# Patient Record
Sex: Female | Born: 1995 | Race: White | Hispanic: No | Marital: Single | Smoking: Never smoker
Health system: Southern US, Community
[De-identification: ages and names within clinical notes are randomized; demographics above are authoritative.]

---
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
## 2021-05-30 ENCOUNTER — Other Ambulatory Visit: Payer: Self-pay

## 2021-05-30 ENCOUNTER — Encounter: Payer: Self-pay | Admitting: Emergency Medicine

## 2021-05-30 DIAGNOSIS — Z5321 Procedure and treatment not carried out due to patient leaving prior to being seen by health care provider: Secondary | ICD-10-CM | POA: Insufficient documentation

## 2021-05-30 DIAGNOSIS — R109 Unspecified abdominal pain: Secondary | ICD-10-CM | POA: Insufficient documentation

## 2021-05-30 DIAGNOSIS — K625 Hemorrhage of anus and rectum: Secondary | ICD-10-CM | POA: Insufficient documentation

## 2021-05-30 LAB — CBC WITH DIFFERENTIAL/PLATELET
Abs Immature Granulocytes: 0.03 10*3/uL (ref 0.00–0.07)
Basophils Absolute: 0 10*3/uL (ref 0.0–0.1)
Basophils Relative: 0 %
Eosinophils Absolute: 0.2 10*3/uL (ref 0.0–0.5)
Eosinophils Relative: 3 %
HCT: 35.1 % — ABNORMAL LOW (ref 36.0–46.0)
Hemoglobin: 11.1 g/dL — ABNORMAL LOW (ref 12.0–15.0)
Immature Granulocytes: 0 %
Lymphocytes Relative: 28 %
Lymphs Abs: 2.5 10*3/uL (ref 0.7–4.0)
MCH: 29.3 pg (ref 26.0–34.0)
MCHC: 31.6 g/dL (ref 30.0–36.0)
MCV: 92.6 fL (ref 80.0–100.0)
Monocytes Absolute: 0.7 10*3/uL (ref 0.1–1.0)
Monocytes Relative: 7 %
Neutro Abs: 5.5 10*3/uL (ref 1.7–7.7)
Neutrophils Relative %: 62 %
Platelets: 254 10*3/uL (ref 150–400)
RBC: 3.79 MIL/uL — ABNORMAL LOW (ref 3.87–5.11)
RDW: 13.9 % (ref 11.5–15.5)
WBC: 8.9 10*3/uL (ref 4.0–10.5)
nRBC: 0 % (ref 0.0–0.2)

## 2021-05-30 LAB — COMPREHENSIVE METABOLIC PANEL
ALT: 13 U/L (ref 0–44)
AST: 15 U/L (ref 15–41)
Albumin: 3.8 g/dL (ref 3.5–5.0)
Alkaline Phosphatase: 36 U/L — ABNORMAL LOW (ref 38–126)
Anion gap: 4 — ABNORMAL LOW (ref 5–15)
BUN: 8 mg/dL (ref 6–20)
CO2: 27 mmol/L (ref 22–32)
Calcium: 8.9 mg/dL (ref 8.9–10.3)
Chloride: 106 mmol/L (ref 98–111)
Creatinine, Ser: 0.69 mg/dL (ref 0.44–1.00)
GFR, Estimated: >60 mL/min (ref >60)
Glucose, Bld: 107 mg/dL — ABNORMAL HIGH (ref 70–99)
Potassium: 3.6 mmol/L (ref 3.5–5.1)
Sodium: 137 mmol/L (ref 135–145)
Total Bilirubin: 0.4 mg/dL (ref 0.3–1.2)
Total Protein: 7 g/dL (ref 6.5–8.1)

## 2021-05-30 LAB — URINALYSIS, ROUTINE W REFLEX MICROSCOPIC
Bilirubin Urine: NEGATIVE
Glucose, UA: NEGATIVE mg/dL
Ketones, ur: NEGATIVE mg/dL
Leukocytes,Ua: NEGATIVE
Nitrite: NEGATIVE
Protein, ur: NEGATIVE mg/dL
Specific Gravity, Urine: 1.02 (ref 1.005–1.030)
pH: 7.5 (ref 5.0–8.0)

## 2021-05-30 LAB — POC URINE PREG, ED: Preg Test, Ur: NEGATIVE

## 2021-05-30 LAB — URINALYSIS, MICROSCOPIC (REFLEX): Bacteria, UA: NONE SEEN

## 2021-05-30 LAB — LIPASE, BLOOD: Lipase: 32 U/L (ref 11–51)

## 2021-05-30 NOTE — ED Triage Notes (Signed)
Patient ambulatory to triage with steady gait, without difficulty or distress noted; pt reports rectal bleeding for last yr, c/o lower abd pain as well

## 2021-05-31 ENCOUNTER — Emergency Department
Admission: EM | Admit: 2021-05-31 | Discharge: 2021-05-31 | Disposition: A | Discharge status: Home / Self Care | Payer: Self-pay | Attending: Emergency Medicine | Admitting: Emergency Medicine

## 2021-05-31 NOTE — ED Notes (Signed)
No answer when called several times from lobby 

## 2022-08-14 LAB — PANORAMA PRENATAL TEST FULL PANEL:PANORAMA TEST PLUS 5 ADDITIONAL MICRODELETIONS: FETAL FRACTION: 13.4

## 2023-08-28 ENCOUNTER — Ambulatory Visit
Admission: EM | Admit: 2023-08-28 | Discharge: 2023-08-28 | Attending: Emergency Medicine | Admitting: Emergency Medicine

## 2023-08-28 DIAGNOSIS — R1031 Right lower quadrant pain: Secondary | ICD-10-CM | POA: Insufficient documentation

## 2023-08-28 LAB — URINALYSIS, W/ REFLEX TO CULTURE (INFECTION SUSPECTED)
Bilirubin Urine: NEGATIVE
Glucose, UA: NEGATIVE mg/dL
Hgb urine dipstick: NEGATIVE
Ketones, ur: NEGATIVE mg/dL
Leukocytes,Ua: NEGATIVE
Nitrite: NEGATIVE
Protein, ur: NEGATIVE mg/dL
Specific Gravity, Urine: 1.015 (ref 1.005–1.030)
pH: 6 (ref 5.0–8.0)

## 2023-08-28 LAB — PREGNANCY, URINE: Preg Test, Ur: NEGATIVE

## 2023-08-28 MED ORDER — KETOROLAC TROMETHAMINE 30 MG/ML IJ SOLN
30.0000 mg | Freq: Once | INTRAMUSCULAR | Status: AC
  Administered 2023-08-28: 30 mg via INTRAMUSCULAR

## 2023-08-28 NOTE — ED Triage Notes (Addendum)
 RLQ pain x 2 days. Patient states that this has happened before about a month ago. States that nothing made it better the pain just went away. No fever. No other sx. Patient states that pain is worse when she walks. 10 months postpartum. LMP 08/13/23. Last BM this afternoon. Normal BM

## 2023-08-28 NOTE — ED Notes (Signed)
 Patient is being discharged from the Urgent Care and sent to the Emergency Department via personal vehicle . Per Dr. Chaney Malling, patient is in need of higher level of care due to right side abdominal pain. Patient is aware and verbalizes understanding of plan of care.  Vitals:   08/28/23 1924  BP: (!) 140/98  Pulse: 88  Resp: 19  Temp: 98.1 F (36.7 C)  SpO2: 97%

## 2023-08-28 NOTE — Discharge Instructions (Signed)
 Your UA is negative for UTI, urine pregnancy negative.  Differential includes appendicitis, TOA, ovarian cyst, ovarian torsion, obstructing nephrolithiasis, colitis.  Unfortunately, we do not have imaging to evaluate for these entities.  I have given you Toradol 30 mg IM here.  Do not have anything to eat or drink until your ER evaluation is complete.  Let them know if your pain changes or gets worse.

## 2023-08-28 NOTE — ED Provider Notes (Signed)
 HPI  SUBJECTIVE:  Norma Fletcher is a 28 y.o. female who presents with sharp, throbbing, intermittent, hours long nonmigratory, nonradiating right lower quadrant/flank pain that is getting worse.  Symptoms started yesterday.  She reports anorexia, and states that the car ride over here was painful.  No nausea, vomiting, fevers, urinary complaints, vaginal odor, bleeding, discharge, abdominal distention, back pain.  She had a normal bowel movement at 1630 today with no change in her pain.  She had similar symptoms like this 1 month ago, but it resolved on its own.  This episode is worse than last months episode, rating today's pain as 9/10.  No antipyretic in the past 6 hours.  She tried Aleve with some improvement in her symptoms.  Symptoms are worse with walking.  Is not associated with p.o., urination or defecation.  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  No history of UTI, pyelonephritis, nephrolithiasis, PCOS, ovarian cyst, PID, STDs, ovarian torsion, abdominal surgeries.  LMP: 3/10.  PCP: [facility].  History reviewed. No pertinent past medical history.  History reviewed. No pertinent surgical history.  History reviewed. No pertinent family history.  Social History   Tobacco Use   Smoking status: Never  Vaping Use   Vaping status: Never Used  Substance Use Topics   Alcohol use: No   Drug use: Yes    Types: Marijuana    No current facility-administered medications for this encounter. No current outpatient medications on file.  No Known Allergies   ROS  As noted in HPI.   Physical Exam  BP (!) 140/98 (BP Location: Left Arm)   Pulse 88   Temp 98.1 F (36.7 C) (Oral)   Resp 19   LMP 08/13/2023 (Exact Date)   SpO2 97%   Constitutional: Well developed, well nourished, appears uncomfortable. Eyes:  EOMI, conjunctiva normal bilaterally HENT: Normocephalic, atraumatic,mucus membranes moist Respiratory: Normal inspiratory effort Cardiovascular: Normal rate GI:  nondistended, soft.  Positive right flank and right lower quadrant tenderness with mild rebound.  No guarding.  Active bowel sounds.  Negative Rovsing.  Negative Murphy. Back: No CVAT skin: No rash, skin intact Musculoskeletal: no deformities Neurologic: Alert & oriented x 3, no focal neuro deficits Psychiatric: Speech and behavior appropriate   ED Course   Medications  ketorolac (TORADOL) 30 MG/ML injection 30 mg (30 mg Intramuscular Given 08/28/23 2016)    Orders Placed This Encounter  Procedures   Urinalysis, w/ Reflex to Culture (Infection Suspected) -Urine, Clean Catch    Standing Status:   Standing    Number of Occurrences:   1    Specimen Source:   Urine, Clean Catch [76]   Pregnancy, urine    Standing Status:   Standing    Number of Occurrences:   1    Results for orders placed or performed during the hospital encounter of 08/28/23 (from the past 24 hours)  Urinalysis, w/ Reflex to Culture (Infection Suspected) -Urine, Clean Catch     Status: Abnormal   Collection Time: 08/28/23  7:29 PM  Result Value Ref Range   Specimen Source URINE, CLEAN CATCH    Color, Urine YELLOW YELLOW   APPearance CLEAR CLEAR   Specific Gravity, Urine 1.015 1.005 - 1.030   pH 6.0 5.0 - 8.0   Glucose, UA NEGATIVE NEGATIVE mg/dL   Hgb urine dipstick NEGATIVE NEGATIVE   Bilirubin Urine NEGATIVE NEGATIVE   Ketones, ur NEGATIVE NEGATIVE mg/dL   Protein, ur NEGATIVE NEGATIVE mg/dL   Nitrite NEGATIVE NEGATIVE  Leukocytes,Ua NEGATIVE NEGATIVE   Squamous Epithelial / HPF 6-10 0 - 5 /HPF   WBC, UA 0-5 0 - 5 WBC/hpf   RBC / HPF 0-5 0 - 5 RBC/hpf   Bacteria, UA FEW (A) NONE SEEN  Pregnancy, urine     Status: None   Collection Time: 08/28/23  7:29 PM  Result Value Ref Range   Preg Test, Ur NEGATIVE NEGATIVE   No results found.  ED Clinical Impression  1. Right lower quadrant abdominal pain      ED Assessment/Plan     UA negative for UTI, urine pregnancy negative.  Differential  includes appendicitis, TOA, ovarian cyst, ovarian torsion, obstructing nephrolithiasis, colitis.  Unfortunately, we do not have imaging to evaluate for these entities.  Giving Toradol 30 mg IM here.  Advised patient to be n.p.o. until ER evaluation is complete.  They are going to go to [facility] emergency department.  Discussed rationale for transfer to the emergency department.  She is stable to go by private vehicle.  They agree to go. .   Meds ordered this encounter  Medications   ketorolac (TORADOL) 30 MG/ML injection 30 mg      *This clinic note was created using Scientist, clinical (histocompatibility and immunogenetics). Therefore, there may be occasional mistakes despite careful proofreading.  ?    Domenick Gong, MD 08/28/23 2019

## 2024-03-21 ENCOUNTER — Ambulatory Visit

## 2024-03-21 DIAGNOSIS — Z113 Encounter for screening for infections with a predominantly sexual mode of transmission: Secondary | ICD-10-CM

## 2024-03-21 DIAGNOSIS — B3731 Acute candidiasis of vulva and vagina: Secondary | ICD-10-CM

## 2024-03-21 LAB — WET PREP FOR TRICH, YEAST, CLUE
Clue Cell Exam: NEGATIVE
Trichomonas Exam: NEGATIVE

## 2024-03-21 LAB — HM HIV SCREENING LAB: HM HIV Screening: NEGATIVE

## 2024-03-21 MED ORDER — FLUCONAZOLE 150 MG PO TABS: 150.0000 mg | ORAL_TABLET | Freq: Once | ORAL | Status: AC

## 2024-03-21 NOTE — Progress Notes (Signed)
 [facility] Department STI clinic [address], [address] [place] [address] Main phone: [phone]  STI screening visit  Subjective:  Norma Fletcher is a 28 y.o. female being seen today for an STI screening visit. The patient reports they do not have symptoms.  Patient reports that they do not desire a pregnancy in the next year. Patient is currently using female condom to prevent pregnancy. They reported they are not interested in discussing contraception today.    Patient's last menstrual period was 02/21/2024 (approximate).  Patient has the following medical conditions:  There are no active problems to display for this patient.  Chief Complaint  Patient presents with   SEXUALLY TRANSMITTED DISEASE   HPI Patient reports vaginal irritation, swelling, itching, odor. Unfortunately found out that her partner had other partners. Last sex was 03/16/24 and her symptoms started over last few days. Does the patient using douching products? No  See flowsheet for further details and programmatic requirements Hyperlink available at the top of the signed note in blue.  Flow sheet content below:  Pregnancy Intention Screening Does the patient want to become pregnant in the next year?: No Does the patient's partner want to become pregnant in the next year?: No Would the patient like to discuss contraceptive options today?: No Reason For STD Screen STD Screening: Has symptoms Have you ever had an STD?: No History of Antibiotic use in the past 2 weeks?: No STD Symptoms Denies all: No Genital Itching: Yes Lower abdominal pain: Yes Discharge: No Dysuria: No Genital ulcer / lesion: No Rash: No Vaginal irritation: Yes Oral / Other skin ulcer: No Pain with sex: No Sore Throat: No Visual Changes: No Vaginal Bleeding: Yes Vaginal bleeding s/s: after sex Risk Factors for Hep B Household, sexual, or needle sharing contact of a person infected with Hep B:  No Sexual contact with a person who uses drugs not as prescribed?: No Currently or Ever used drugs not as prescribed: No HIV Positive: No PRep Patient: No Men who have sex with men: No Have Hepatitis C: No History of Incarceration: No History of Homeslessness?: No Anal sex following anal drug use?: No Risk Factors for Hep C Currently using drugs not as prescribed: No Sexual partner(s) currently using drugs as not prescribed: No History of drug use: No HIV Positive: No People with a history of incarceration: No People born between the years of 64 and 42: No Abuse History Has patient ever been abused physically?: No Has patient ever been abused sexually?: No Does patient feel they have a problem with Anxiety?: Yes Does patient feel they have a problem with Depression?: No Counseling Medication side effects discussed with patient?: Yes Patient counseled to abstain from sex for: 7 days Patient counseled to use condoms with all sex: Condoms declined RTC in 2-3 weeks for test results: Yes Clinic will call if test results abnormal before test result appt.: Yes Test results given to patient Patient counseled to use condoms with all sex: Condoms declined STD Treatment Patient counseled to abstain from sex for: 7 days   Screening for MPX risk:  Unexplained rash?  No   MSM?  No   Multiple or anonymous sex partners?  No   Any close or sexual contact with a person  diagnosed with MPX?  No   Any outside the [place]  where MPX is endemic?  No   High clinical suspicion for MPX?    -Unlikely to be chickenpox    -Lymphadenopathy    -  Rash that presents in same phase of       evolution on any given body part  No   Screenings: Last HIV test per patient/review of record was No results found for: HMHIVSCREEN No results found for: HIV   Last HEPC test per patient/review of record was No results found for: HMHEPCSCREEN No components found for: HEPC   Last HEPB test per patient/review  of record was No components found for: HMHEPBSCREEN   Patient reports last pap was:   No results found for: SPECADGYN No Cervical Cancer Screening results to display.  Immunization history:   There is no immunization history on file for this patient.  The following portions of the patient's history were reviewed and updated as appropriate: allergies, current medications, past medical history, past social history, past surgical history and problem list.  Objective:  There were no vitals filed for this visit.  Physical Exam Vitals and nursing note reviewed. Exam conducted with a chaperone present Brett Orange).  Constitutional:      Appearance: Normal appearance.  HENT:     Head: Normocephalic and atraumatic.     Mouth/Throat:     Mouth: Mucous membranes are moist.     Pharynx: Oropharynx is clear. No oropharyngeal exudate or posterior oropharyngeal erythema.  Pulmonary:     Effort: Pulmonary effort is normal.  Abdominal:     General: Abdomen is flat.     Palpations: There is no mass.     Tenderness: There is no abdominal tenderness. There is no rebound.  Genitourinary:    General: Normal vulva.     Exam position: Lithotomy position.     Pubic Area: No rash or pubic lice.      Labia:        Right: No rash or lesion.        Left: No rash or lesion.      Vagina: Vaginal discharge present. No erythema, bleeding or lesions.     Cervix: No cervical motion tenderness, discharge, friability, lesion or erythema.     Comments: pH = <4.5 Thick, white clumpy discharge  Lymphadenopathy:     Head:     Right side of head: No preauricular or posterior auricular adenopathy.     Left side of head: No preauricular or posterior auricular adenopathy.     Cervical: No cervical adenopathy.     Upper Body:     Right upper body: No supraclavicular, axillary or epitrochlear adenopathy.     Left upper body: No supraclavicular, axillary or epitrochlear adenopathy.     Lower Body: No right  inguinal adenopathy. No left inguinal adenopathy.  Skin:    General: Skin is warm and dry.     Findings: No rash.  Neurological:     Mental Status: She is alert and oriented to person, place, and time.    Assessment and Plan:  Norma Fletcher is a 28 y.o. female presenting to the [facility] Department for STI screening  1. Screening for venereal disease (Primary)  - WET PREP FOR TRICH, YEAST, CLUE - Chlamydia/Gonorrhea [place] [facility] - HIV [place] [facility] - Syphilis Serology,  Lab - Gonococcus culture  2. Vulvovaginal candidiasis  - fluconazole (DIFLUCAN) 150 MG tablet; Take 1 tablet (150 mg total) by mouth once for 1 dose.   Patient accepted the following screenings: oral GC culture, vaginal CT/GC swab, vaginal wet prep, HIV, and RPR Patient meets criteria for HepB screening? No. Ordered? no Patient meets criteria for HepC  screening? No. Ordered? no  Treat wet prep per standing order Discussed time line for State Lab results and that patient will be called with positive results and encouraged patient to call if she had not heard in 2 weeks.  Counseled to return or seek care for continued or worsening symptoms Recommended repeat testing in 3 months with positive results. Recommended condom use with all sex for STI prevention.   Return if symptoms worsen or fail to improve.  No future appointments.  Damien FORBES Satchel, NP

## 2024-03-21 NOTE — Progress Notes (Signed)
 Pt is here for STD screening. Wet prep results reviewed with patient. Pt was dispensed Diflucan 150 mg tablet orally once. Opportunity given to pt to ask questions for any clarifications, Questions answered. Condoms declined. Kwadwo Raveen Wieseler,RN.

## 2024-03-25 LAB — GONOCOCCUS CULTURE
